# Patient Record
Sex: Female | Born: 1975 | Race: White | Hispanic: No | Marital: Married | Smoking: Never smoker
Health system: Southern US, Community
[De-identification: ages and names within clinical notes are randomized; demographics above are authoritative.]

## PROBLEM LIST (undated history)

## (undated) HISTORY — PX: CHOLECYSTECTOMY: SHX55

---

## 2008-03-17 ENCOUNTER — Inpatient Hospital Stay: Payer: Self-pay

## 2010-02-13 ENCOUNTER — Ambulatory Visit: Payer: Self-pay | Admitting: Internal Medicine

## 2010-02-20 ENCOUNTER — Ambulatory Visit: Payer: Self-pay | Admitting: Surgery

## 2010-02-23 ENCOUNTER — Ambulatory Visit: Payer: Self-pay | Admitting: Surgery

## 2012-02-02 IMAGING — US ABDOMEN ULTRASOUND
1 series · 17 of 25 positions shown · non-contrast
Comparison: none

REASON FOR EXAM: ABD PAIN EPIGASTRIC
COMMENTS:

[Series 1: abdomen ultrasound · 17 of 119 slices shown]
[im 1/119]
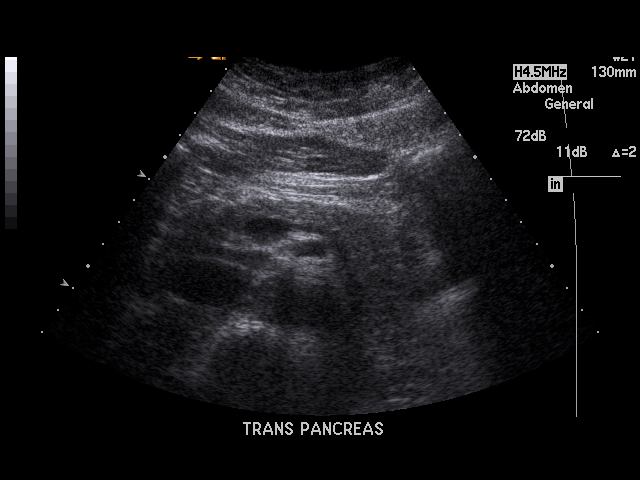
[im 10/119]
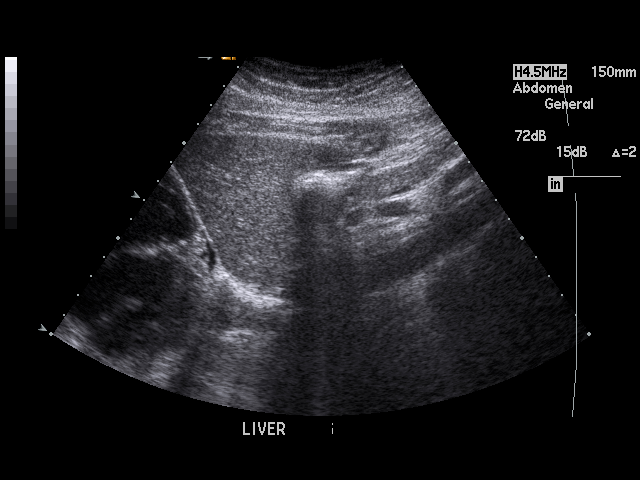
[im 15/119]
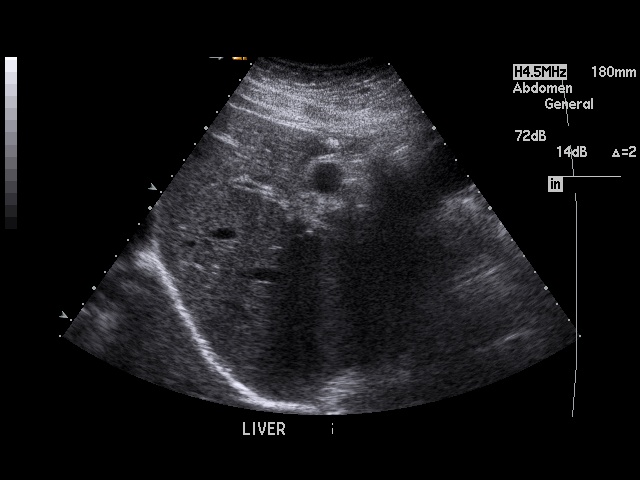
[im 25/119]
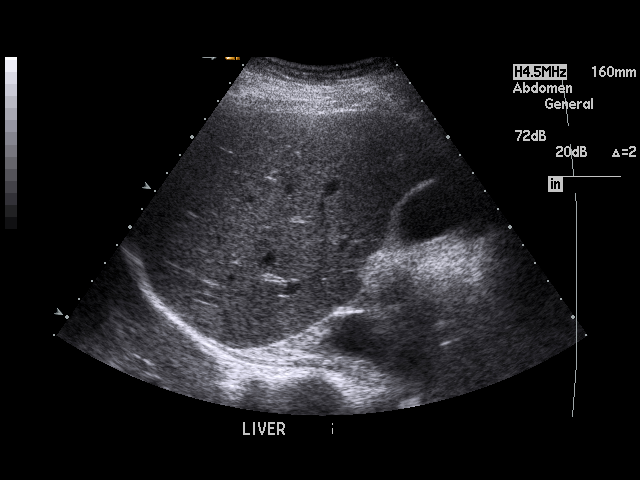
[im 30/119]
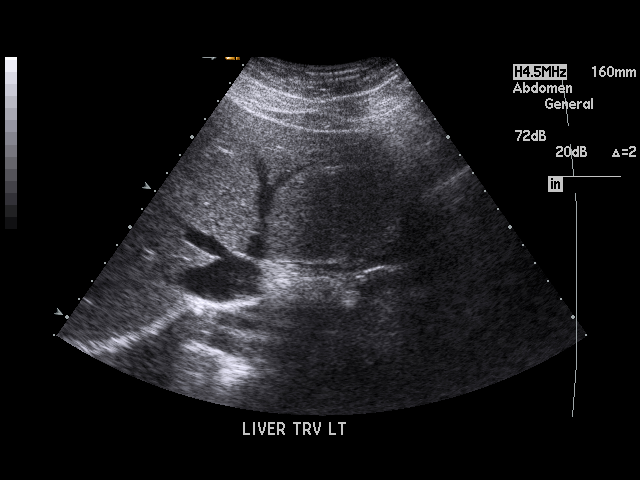
[im 40/119]
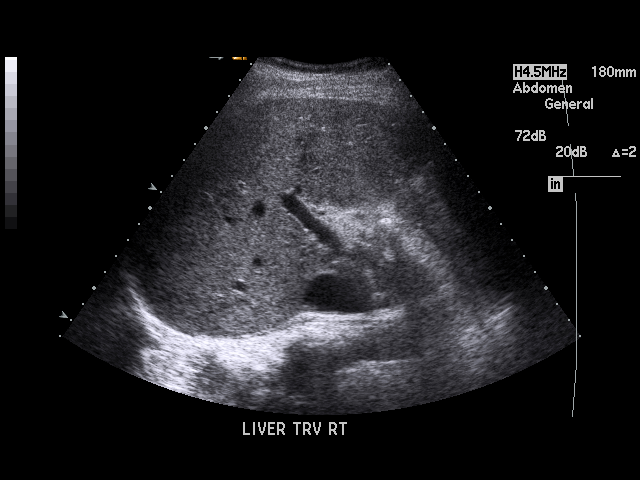
[im 45/119]
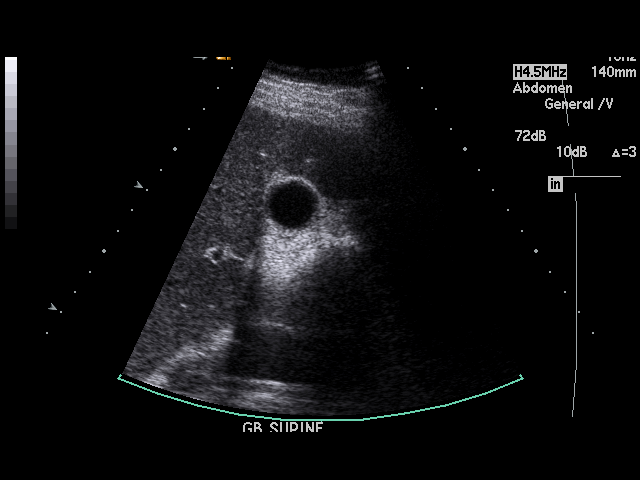
[im 55/119]
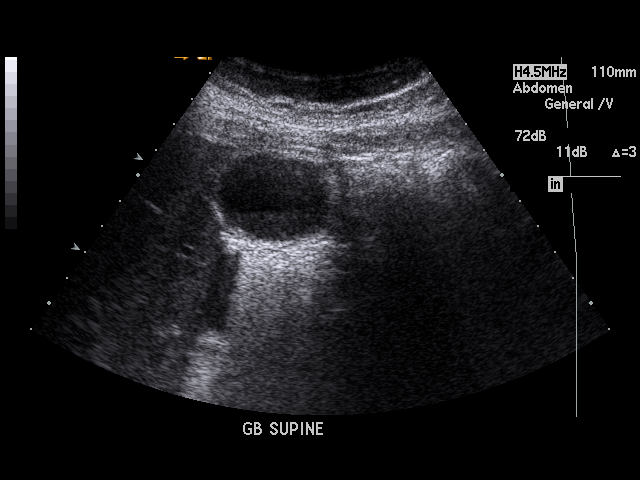
[im 60/119]
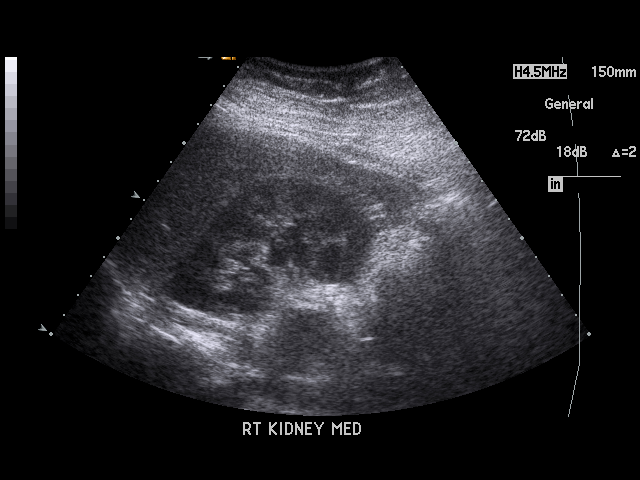
[im 64/119]
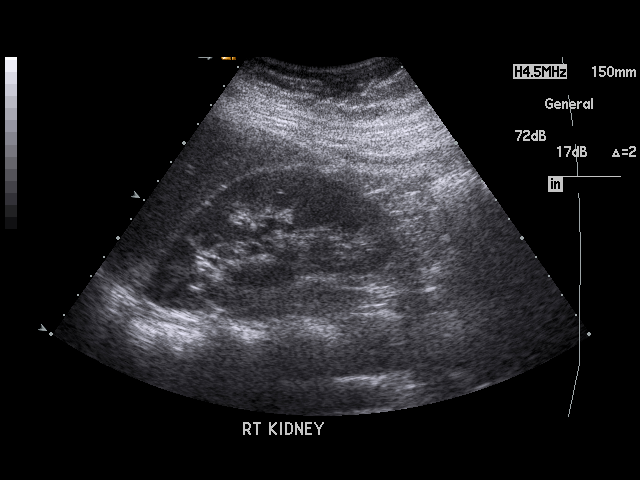
[im 74/119]
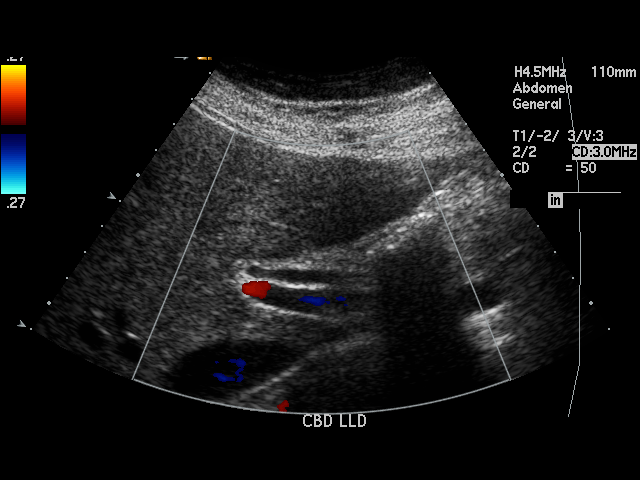
[im 79/119]
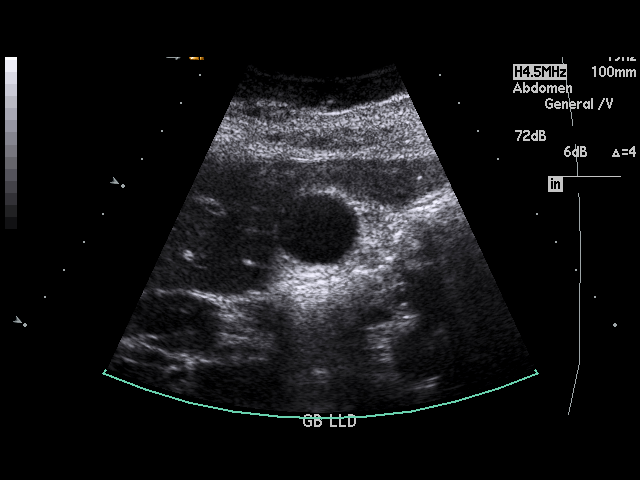
[im 89/119]
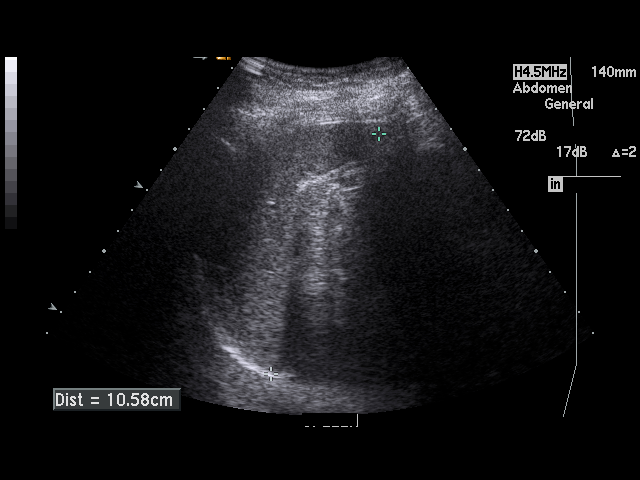
[im 94/119]
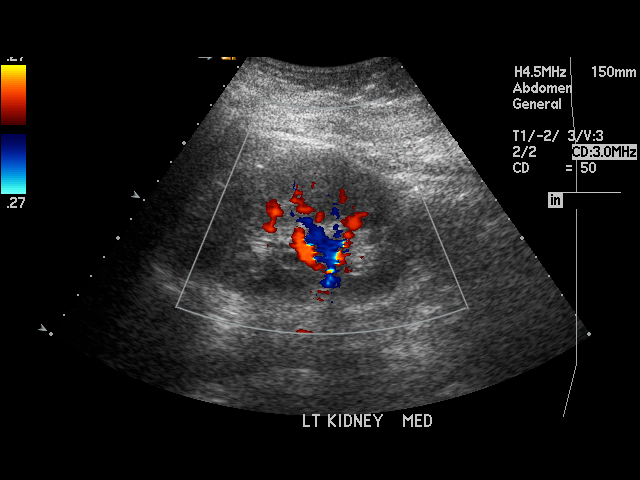
[im 104/119]
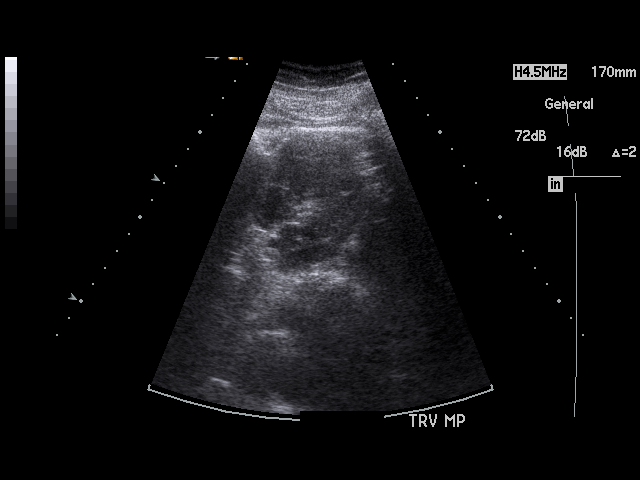
[im 109/119]
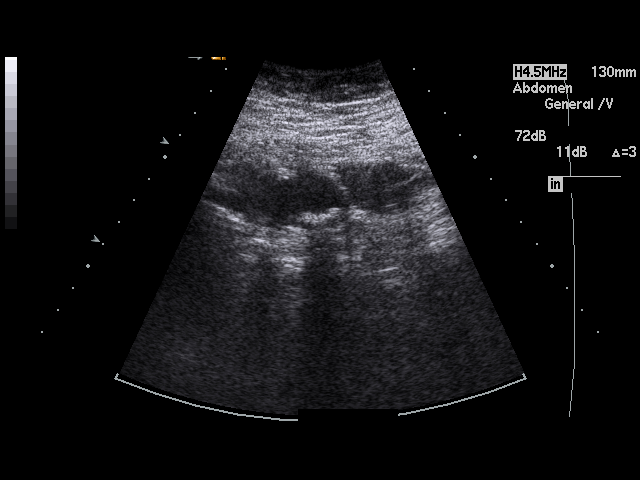
[im 119/119]
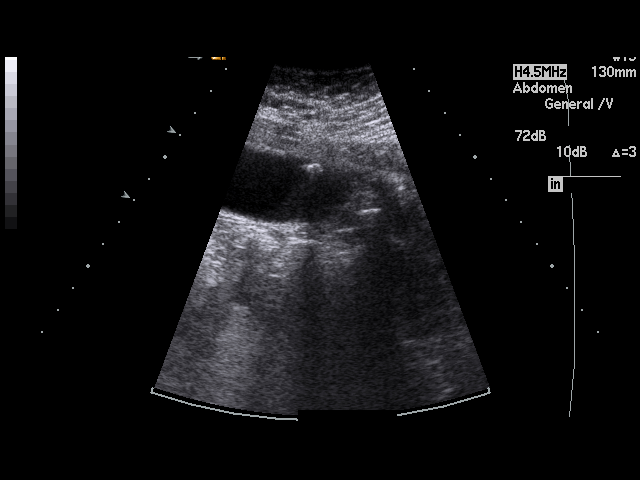

[17 of 25 positions shown; findings below may reference images not displayed]

PROCEDURE:     US  - US ABDOMEN GENERAL SURVEY  - February 13, 2010  [DATE]

RESULT:     The liver, spleen, pancreas, abdominal aorta and inferior vena
cava show no significant abnormalities. There are multiple echodensities in
the gallbladder compatible with gallstones. No thickening of the gallbladder
wall is identified. No pericholecystic fluid is seen. The common bile duct
measures 4.8 mm in diameter which is within normal limits. The kidneys show
no hydronephrosis. There is no ascites.
IMPRESSION: 1. Cholelithiasis with no definite thickening of the gallbladder wall or
pericholecystic fluid observed.
2. Sludge is present in the gallbladder.
3. The common bile duct is normal in size.
4. No ascites is seen.

## 2014-09-24 ENCOUNTER — Ambulatory Visit: Payer: Self-pay | Admitting: Physician Assistant

## 2018-01-01 ENCOUNTER — Ambulatory Visit
Admission: EM | Admit: 2018-01-01 | Discharge: 2018-01-01 | Disposition: A | Discharge status: Home / Self Care | Payer: Self-pay | Attending: Emergency Medicine | Admitting: Emergency Medicine

## 2018-01-01 ENCOUNTER — Other Ambulatory Visit: Payer: Self-pay

## 2018-01-01 DIAGNOSIS — J069 Acute upper respiratory infection, unspecified: Secondary | ICD-10-CM

## 2018-01-01 MED ORDER — BENZONATATE 200 MG PO CAPS: ORAL_CAPSULE | ORAL | 0 refills | Status: DC

## 2018-01-01 MED ORDER — HYDROCODONE-HOMATROPINE 5-1.5 MG/5ML PO SYRP: 5.0000 mL | ORAL_SOLUTION | Freq: Four times a day (QID) | ORAL | 0 refills | Status: DC | PRN

## 2018-01-01 NOTE — ED Triage Notes (Signed)
Pt with cough x past week. Denies fever. Reports phlegm is yellow/green. Chest is tight.

## 2018-01-01 NOTE — Discharge Instructions (Signed)
Use Flonase nasal spray for 3-4 weeks to promote drainage

## 2018-01-01 NOTE — ED Provider Notes (Signed)
[facility]    CSN: [id number] Arrival date & time: 01/01/18  0803     History   Chief Complaint Chief Complaint  Patient presents with  . Cough    HPI Ece Cumberland is a 42 y.o. female.   HPI  42 year old female presents with cough of yellow-green sputum that she has had for about 1 week.  Feels tightness in her chest.  Daughter is on a traveling softball team and they spend many hours in the cold wet weather she thinks may precipitated her symptoms.  She is having difficulty sleeping at night because of the consistent cough.  Tried sleeping in a recliner is unable to lie flat but no position seems to facilitate her sleep.       History reviewed. No pertinent past medical history.  There are no active problems to display for this patient.   Past Surgical History:  Procedure Laterality Date  . CHOLECYSTECTOMY      OB History   None      Home Medications    Prior to Admission medications   Medication Sig Start Date End Date Taking? Authorizing Provider  levonorgestrel (MIRENA) 20 MCG/24HR IUD 1 each by Intrauterine route once.   Yes [provider]  benzonatate (TESSALON) 200 MG capsule Take one cap TID PRN cough 01/01/18   Ovid Curd P, PA-C  HYDROcodone-homatropine [facility]) 5-1.5 MG/5ML syrup Take 5 mLs by mouth every 6 (six) hours as needed. 01/01/18   Lutricia Feil, PA-C    Family History History reviewed. No pertinent family history.  Social History Social History   Tobacco Use  . Smoking status: Never Smoker  . Smokeless tobacco: Never Used  Substance Use Topics  . Alcohol use: Yes    Comment: social  . Drug use: Never     Allergies   Patient has no known allergies.   Review of Systems Review of Systems  Constitutional: Positive for activity change. Negative for chills, fatigue and fever.  HENT: Positive for congestion.   Respiratory: Positive for cough. Negative for shortness of breath, wheezing and  stridor.   All other systems reviewed and are negative.    Physical Exam Triage Vital Signs ED Triage Vitals  Enc Vitals Group     BP 01/01/18 0815 133/84     Pulse Rate 01/01/18 0815 70     Resp 01/01/18 0815 18     Temp 01/01/18 0815 97.8 F (36.6 C)     Temp Source 01/01/18 0815 Oral     SpO2 01/01/18 0815 100 %     Weight 01/01/18 0816 206 lb (93.4 kg)     Height 01/01/18 0816 5\' 7"  (1.702 m)     Head Circumference --      Peak Flow --      Pain Score 01/01/18 0816 1     Pain Loc --      Pain Edu? --      Excl. in GC? --    No data found.  Updated Vital Signs BP 133/84 (BP Location: Right Arm)   Pulse 70   Temp 97.8 F (36.6 C) (Oral)   Resp 18   Ht 5\' 7"  (1.702 m)   Wt 206 lb (93.4 kg)   SpO2 100%   BMI 32.26 kg/m   Visual Acuity Right Eye Distance:   Left Eye Distance:   Bilateral Distance:    Right Eye Near:   Left Eye Near:    Bilateral Near:  Physical Exam  Constitutional: She is oriented to person, place, and time. She appears well-developed and well-nourished. No distress.  HENT:  Head: Normocephalic.  Right Ear: External ear normal.  Left Ear: External ear normal.  Nose: Nose normal.  Mouth/Throat: Oropharynx is clear and moist. No oropharyngeal exudate.  Eyes: Pupils are equal, round, and reactive to light. Right eye exhibits no discharge. Left eye exhibits no discharge.  Neck: Normal range of motion.  Pulmonary/Chest: Effort normal and breath sounds normal.  Musculoskeletal: Normal range of motion.  Lymphadenopathy:    She has no cervical adenopathy.  Neurological: She is alert and oriented to person, place, and time.  Skin: Skin is warm and dry. She is not diaphoretic.  Psychiatric: She has a normal mood and affect. Her behavior is normal. Judgment and thought content normal.  Nursing note and vitals reviewed.    UC Treatments / Results  Labs (all labs ordered are listed, but only abnormal results are displayed) Labs Reviewed -  No data to display  EKG None Radiology No results found.  Procedures Procedures (including critical care time)  Medications Ordered in UC Medications - No data to display   Initial Impression / Assessment and Plan / UC Course  I have reviewed the triage vital signs and the nursing notes.  Pertinent labs & imaging results that were available during my care of the patient were reviewed by me and considered in my medical decision making (see chart for details).     Plan: 1. Test/x-ray results and diagnosis reviewed with patient 2. rx as per orders; risks, benefits, potential side effects reviewed with patient 3. Recommend supportive treatment with use of Flonase nasal spray for 3-4 weeks to facilitate drainage.  Treat with cough suppressants to allow her to rest at nighttime and also to suppress the cough during the daytime.  We will increase her fluids and rest as much as feasible.  I have advised her this is a virus and does not require antibiotics at this point time.  She is not improving she should follow-up with her primary care physician or may return to our clinic 4. F/u prn if symptoms worsen or don't improve   Final Clinical Impressions(s) / UC Diagnoses   Final diagnoses:  Upper respiratory tract infection, unspecified type    ED Discharge Orders        Ordered    benzonatate (TESSALON) 200 MG capsule     01/01/18 0836    HYDROcodone-homatropine (HYCODAN) 5-1.5 MG/5ML syrup  Every 6 hours PRN     01/01/18 0836       Controlled Substance Prescriptions [place] Controlled Substance Registry consulted? Not Applicable   Lutricia FeilRoemer, Yalonda Sample P, PA-C 01/01/18 [id number]

## 2018-09-24 ENCOUNTER — Ambulatory Visit
Admission: EM | Admit: 2018-09-24 | Discharge: 2018-09-24 | Disposition: A | Discharge status: Home / Self Care | Payer: Self-pay | Attending: Family Medicine | Admitting: Family Medicine

## 2018-09-24 ENCOUNTER — Encounter: Payer: Self-pay | Admitting: Gynecology

## 2018-09-24 ENCOUNTER — Other Ambulatory Visit: Payer: Self-pay

## 2018-09-24 DIAGNOSIS — J988 Other specified respiratory disorders: Secondary | ICD-10-CM

## 2018-09-24 MED ORDER — DOXYCYCLINE HYCLATE 100 MG PO CAPS: 100.0000 mg | ORAL_CAPSULE | Freq: Two times a day (BID) | ORAL | 0 refills | Status: DC

## 2018-09-24 MED ORDER — BENZONATATE 100 MG PO CAPS: 100.0000 mg | ORAL_CAPSULE | Freq: Three times a day (TID) | ORAL | 0 refills | Status: DC | PRN

## 2018-09-24 MED ORDER — HYDROCOD POLST-CPM POLST ER 10-8 MG/5ML PO SUER: 5.0000 mL | Freq: Two times a day (BID) | ORAL | 0 refills | Status: DC | PRN

## 2018-09-24 NOTE — Discharge Instructions (Signed)
Medication as prescribed.  Take care  Dr. Sabien Umland  

## 2018-09-24 NOTE — ED Provider Notes (Signed)
[facility]    CSN: [id number] Arrival date & time: 09/24/18  1406   History   Chief Complaint Cough, congestion  HPI 42 year old female presents with cough and congestion.  Started on Tuesday.  She reports cough and chest congestion.  Cough is productive of discolored sputum.  Also reports sinus congestion.  No fever.  Seems to be worsening.  Has had no relief at home.  No known exacerbating factors.  No reports of chills.  No other associated symptoms.  No other complaints.  Social hx reviewed as below. Social History Social History   Tobacco Use  . Smoking status: Never Smoker  . Smokeless tobacco: Never Used  Substance Use Topics  . Alcohol use: Yes    Comment: social  . Drug use: Never   Allergies   Patient has no known allergies.   Review of Systems Review of Systems  Constitutional: Positive for fever.  HENT: Positive for congestion.   Respiratory: Positive for cough.    Physical Exam Triage Vital Signs ED Triage Vitals  Enc Vitals Group     BP 09/24/18 1440 129/74     Pulse Rate 09/24/18 1440 88     Resp 09/24/18 1440 18     Temp 09/24/18 1440 98.1 F (36.7 C)     Temp Source 09/24/18 1438 Oral     SpO2 09/24/18 1440 99 %     Weight 09/24/18 1439 205 lb (93 kg)     Height 09/24/18 1439 5\' 7"  (1.702 m)     Head Circumference --      Peak Flow --      Pain Score 09/24/18 1439 4     Pain Loc --      Pain Edu? --      Excl. in GC? --    Updated Vital Signs BP 129/74 (BP Location: Left Arm)   Pulse 88   Temp 98.1 F (36.7 C) (Oral)   Resp 18   Ht 5\' 7"  (1.702 m)   Wt 93 kg   SpO2 99%   BMI 32.11 kg/m   Visual Acuity Right Eye Distance:   Left Eye Distance:   Bilateral Distance:    Right Eye Near:   Left Eye Near:    Bilateral Near:     Physical Exam Vitals signs and nursing note reviewed.  Constitutional:      General: She is not in acute distress. HENT:     Head: Normocephalic and atraumatic.     Right Ear: Tympanic  membrane normal.     Left Ear: Tympanic membrane normal.     Nose: Nose normal.     Mouth/Throat:     Pharynx: Oropharynx is clear. No posterior oropharyngeal erythema.  Cardiovascular:     Rate and Rhythm: Normal rate and regular rhythm.  Pulmonary:     Effort: Pulmonary effort is normal.     Comments: Coarse breath sounds. Neurological:     Mental Status: She is alert.  Psychiatric:        Mood and Affect: Mood normal.        Behavior: Behavior normal.    UC Treatments / Results  Labs (all labs ordered are listed, but only abnormal results are displayed) Labs Reviewed - No data to display  EKG None  Radiology No results found.  Procedures Procedures (including critical care time)  Medications Ordered in UC Medications - No data to display  Initial Impression / Assessment and Plan / UC Course  I  have reviewed the triage vital signs and the nursing notes.  Pertinent labs & imaging results that were available during my care of the patient were reviewed by me and considered in my medical decision making (see chart for details).    42 year old female presents with a respiratory infection.  Treated with doxycycline given persistent symptoms and worsening.  Tessalon Perles and Tussionex for cough.  Final Clinical Impressions(s) / UC Diagnoses   Final diagnoses:  Respiratory infection     Discharge Instructions     Medication as prescribed.  Take care  Dr. Adriana Simasook    ED Prescriptions    Medication Sig Dispense Auth. Provider   benzonatate (TESSALON) 100 MG capsule Take 1 capsule (100 mg total) by mouth 3 (three) times daily as needed. 30 capsule Reeves Musick G, DO   doxycycline (VIBRAMYCIN) 100 MG capsule Take 1 capsule (100 mg total) by mouth 2 (two) times daily. 14 capsule [place], AvonJayce G, DO   chlorpheniramine-HYDROcodone (TUSSIONEX PENNKINETIC ER) 10-8 MG/5ML SUER Take 5 mLs by mouth every 12 (twelve) hours as needed. 60 mL Tommie Samsook, Tobyn Osgood G, DO     Controlled  Substance Prescriptions [place] Controlled Substance Registry consulted? Not Applicable   Tommie SamsCook, Camren Lipsett G, DO 09/24/18 1626

## 2018-09-24 NOTE — ED Triage Notes (Signed)
Patient c/o cough x 1 week.

## 2020-03-13 ENCOUNTER — Ambulatory Visit
Admission: EM | Admit: 2020-03-13 | Discharge: 2020-03-13 | Disposition: A | Discharge status: Home / Self Care | Payer: Self-pay | Attending: Family Medicine | Admitting: Family Medicine

## 2020-03-13 ENCOUNTER — Encounter: Payer: Self-pay | Admitting: Emergency Medicine

## 2020-03-13 ENCOUNTER — Other Ambulatory Visit: Payer: Self-pay

## 2020-03-13 DIAGNOSIS — K625 Hemorrhage of anus and rectum: Secondary | ICD-10-CM | POA: Insufficient documentation

## 2020-03-13 LAB — COMPREHENSIVE METABOLIC PANEL
ALT: 15 U/L (ref 0–44)
AST: 15 U/L (ref 15–41)
Albumin: 4.2 g/dL (ref 3.5–5.0)
Alkaline Phosphatase: 74 U/L (ref 38–126)
Anion gap: 9 (ref 5–15)
BUN: 10 mg/dL (ref 6–20)
CO2: 21 mmol/L — ABNORMAL LOW (ref 22–32)
Calcium: 8.9 mg/dL (ref 8.9–10.3)
Chloride: 107 mmol/L (ref 98–111)
Creatinine, Ser: 0.83 mg/dL (ref 0.44–1.00)
GFR calc Af Amer: >60 mL/min (ref >60)
GFR calc non Af Amer: >60 mL/min (ref >60)
Glucose, Bld: 111 mg/dL — ABNORMAL HIGH (ref 70–99)
Potassium: 3.9 mmol/L (ref 3.5–5.1)
Sodium: 137 mmol/L (ref 135–145)
Total Bilirubin: 0.3 mg/dL (ref 0.3–1.2)
Total Protein: 8.1 g/dL (ref 6.5–8.1)

## 2020-03-13 LAB — CBC WITH DIFFERENTIAL/PLATELET
Abs Immature Granulocytes: 0.04 10*3/uL (ref 0.00–0.07)
Basophils Absolute: 0 10*3/uL (ref 0.0–0.1)
Basophils Relative: 1 %
Eosinophils Absolute: 0.1 10*3/uL (ref 0.0–0.5)
Eosinophils Relative: 1 %
HCT: 41.3 % (ref 36.0–46.0)
Hemoglobin: 13.7 g/dL (ref 12.0–15.0)
Immature Granulocytes: 1 %
Lymphocytes Relative: 25 %
Lymphs Abs: 2 10*3/uL (ref 0.7–4.0)
MCH: 28.1 pg (ref 26.0–34.0)
MCHC: 33.2 g/dL (ref 30.0–36.0)
MCV: 84.6 fL (ref 80.0–100.0)
Monocytes Absolute: 0.4 10*3/uL (ref 0.1–1.0)
Monocytes Relative: 5 %
Neutro Abs: 5.6 10*3/uL (ref 1.7–7.7)
Neutrophils Relative %: 67 %
Platelets: 264 10*3/uL (ref 150–400)
RBC: 4.88 MIL/uL (ref 3.87–5.11)
RDW: 12.6 % (ref 11.5–15.5)
WBC: 8.2 10*3/uL (ref 4.0–10.5)
nRBC: 0 % (ref 0.0–0.2)

## 2020-03-13 NOTE — Discharge Instructions (Addendum)
CBC normal.  I am placing referral to GI.  You should receive a call soon.   Take care  Dr. Adriana Simas

## 2020-03-13 NOTE — ED Provider Notes (Signed)
[facility]    CSN: [id number] Arrival date & time: 03/13/20  0815  History   Chief Complaint Chief Complaint  Patient presents with  . Blood In Stools   HPI  44 year old female presents with the above complaint.  Patient reports that she had problems yesterday and noted bright red blood in the toilet.  Occurred again today.  Patient reports that she has a family history of colon cancer.  She states that her mother had colon cancer in her 69s.  She was told that she needs screening earlier than the age of 64.  Denies rectal pain.  No nausea, vomiting, diarrhea.  No fever.  No night sweats.  No weight loss.  Patient denies abdominal pain.  She has no other associated symptoms.  No other complaints.  Past Surgical History:  Procedure Laterality Date  . CHOLECYSTECTOMY     Home Medications    Prior to Admission medications   Medication Sig Start Date End Date Taking? Authorizing Provider  levonorgestrel (MIRENA) 20 MCG/24HR IUD 1 each by Intrauterine route once.   Yes [provider]    Family History Family History  Problem Relation Age of Onset  . Cancer Mother     Social History Social History   Tobacco Use  . Smoking status: Never Smoker  . Smokeless tobacco: Never Used  Vaping Use  . Vaping Use: Never used  Substance Use Topics  . Alcohol use: Yes    Comment: social  . Drug use: Never     Allergies   Patient has no known allergies.   Review of Systems Review of Systems  Constitutional: Negative for appetite change, fever and unexpected weight change.  Gastrointestinal: Negative for abdominal pain.       Rectal bleeding.   Physical Exam Triage Vital Signs ED Triage Vitals  Enc Vitals Group     BP 03/13/20 0838 124/77     Pulse Rate 03/13/20 0838 (!) 103     Resp 03/13/20 0838 16     Temp 03/13/20 0838 98.4 F (36.9 C)     Temp Source 03/13/20 0838 Oral     SpO2 03/13/20 0838 98 %     Weight 03/13/20 0836 212 lb (96.2 kg)      Height 03/13/20 0836 5\' 7"  (1.702 m)     Head Circumference --      Peak Flow --      Pain Score 03/13/20 0836 0     Pain Loc --      Pain Edu? --      Excl. in [place]? --    Updated Vital Signs BP 124/77   Pulse (!) 103   Temp 98.4 F (36.9 C) (Oral)   Resp 16   Ht 5\' 7"  (1.702 m)   Wt 96.2 kg   SpO2 98%   BMI 33.20 kg/m   Visual Acuity Right Eye Distance:   Left Eye Distance:   Bilateral Distance:    Right Eye Near:   Left Eye Near:    Bilateral Near:     Physical Exam Vitals and nursing note reviewed.  Constitutional:      General: She is not in acute distress.    Appearance: Normal appearance. She is not ill-appearing.  HENT:     Head: Normocephalic and atraumatic.  Eyes:     General:        Right eye: No discharge.        Left eye: No discharge.  Conjunctiva/sclera: Conjunctivae normal.  Cardiovascular:     Rate and Rhythm: Normal rate and regular rhythm.     Heart sounds: No murmur heard.   Pulmonary:     Effort: Pulmonary effort is normal.     Breath sounds: Normal breath sounds. No wheezing, rhonchi or rales.  Abdominal:     General: There is no distension.     Palpations: Abdomen is soft.     Tenderness: There is no abdominal tenderness.  Neurological:     Mental Status: She is alert.  Psychiatric:        Mood and Affect: Mood normal.        Behavior: Behavior normal.    UC Treatments / Results  Labs (all labs ordered are listed, but only abnormal results are displayed) Labs Reviewed  COMPREHENSIVE METABOLIC PANEL - Abnormal; Notable for the following components:      Result Value   CO2 21 (*)    Glucose, Bld 111 (*)    All other components within normal limits  CBC WITH DIFFERENTIAL/PLATELET    EKG   Radiology No results found.  Procedures Procedures (including critical care time)  Medications Ordered in UC Medications - No data to display  Initial Impression / Assessment and Plan / UC Course  I have reviewed the triage  vital signs and the nursing notes.  Pertinent labs & imaging results that were available during my care of the patient were reviewed by me and considered in my medical decision making (see chart for details).    44 year old female presents with painless rectal bleeding.  CBC normal.  Metabolic panel unremarkable.  Placing referral to GI.  Needs colonoscopy.  Supportive care.  Final Clinical Impressions(s) / UC Diagnoses   Final diagnoses:  Rectal bleeding     Discharge Instructions     CBC normal.  I am placing referral to GI.  You should receive a call soon.   Take care  Dr. Adriana Simas    ED Prescriptions    None     PDMP not reviewed this encounter.   Tommie Sams, [place] 03/13/20 1028

## 2020-03-13 NOTE — ED Triage Notes (Signed)
Patient states she had a BM yesterday with bright red blood in her stool. She states this morning she had another episode.

## 2020-03-17 ENCOUNTER — Encounter: Payer: Self-pay | Admitting: Gastroenterology

## 2020-03-17 ENCOUNTER — Ambulatory Visit (INDEPENDENT_AMBULATORY_CARE_PROVIDER_SITE_OTHER): Payer: Self-pay | Admitting: Gastroenterology

## 2020-03-17 ENCOUNTER — Other Ambulatory Visit: Payer: Self-pay

## 2020-03-17 VITALS — BP 142/84 | HR 111 | Temp 98.3°F | Ht 67.0 in | Wt 236.4 lb

## 2020-03-17 DIAGNOSIS — K625 Hemorrhage of anus and rectum: Secondary | ICD-10-CM

## 2020-03-17 DIAGNOSIS — Z800 Family history of malignant neoplasm of digestive organs: Secondary | ICD-10-CM

## 2020-03-17 MED ORDER — PEG 3350-KCL-NA BICARB-NACL 420 G PO SOLR: ORAL | 0 refills | Status: AC

## 2020-03-17 NOTE — Progress Notes (Signed)
Jasmine Liu [address]  [place]  [place], [place] [address]  Main: [phone]  Fax: [phone]   Gastroenterology Consultation  Referring Provider:     Coral Spikes, DO Primary Care Physician:  Patient, No Pcp Per Reason for Consultation:     Bright red blood per rectum        HPI:    Chief Complaint  Patient presents with  . New Patient (Initial Visit)  . Rectal Bleeding    Patient stated that Jasmine Liu has had rectal bleeding since Thursday.    Jasmine Liu is a 44 y.o. y/o female referred for consultation & management  by Dr. Patient, No Pcp Per.  Patient went to the urgent care recently due to 1 to 2 days of bleeding of blood streaks in stool.  This has since resolved.  No prior history of similar symptoms.  No melena.  No abdominal pain.  No nausea or vomiting.  Reports having bowel movement alternating between 1-2 formed bowel movements daily, and then some days with no bowel movement, and this has been her baseline.  Reports family history of colon cancer in her mother.  No prior colonoscopy.  No dysphagia.  No past medical history on file.  Past Surgical History:  Procedure Laterality Date  . CHOLECYSTECTOMY      Prior to Admission medications   Medication Sig Start Date End Date Taking? Authorizing Provider  levonorgestrel (MIRENA) 20 MCG/24HR IUD 1 each by Intrauterine route once.    [provider]  polyethylene glycol-electrolytes (NULYTELY) 420 g solution Prepare according to package instructions. Starting at 5:00 PM: Drink one 8 oz glass of mixture every 15 minutes until you finish half of the jug. Five hours prior to procedure, drink 8 oz glass of mixture every 15 minutes until it is all gone. Make sure you do not drink anything 4 hours prior to your procedure. 03/17/20   Virgel Manifold, MD    Family History  Problem Relation Age of Onset  . Cancer Mother      Social History   Tobacco Use  . Smoking status: Never Smoker  .  Smokeless tobacco: Never Used  Vaping Use  . Vaping Use: Never used  Substance Use Topics  . Alcohol use: Yes    Comment: social  . Drug use: Never    Allergies as of 03/17/2020  . (No Known Allergies)    Review of Systems:    All systems reviewed and negative except where noted in HPI.   Physical Exam:  BP (!) 142/84   Pulse (!) 111   Temp 98.3 F (36.8 C) (Oral)   Ht 5\' 7"  (1.702 m)   Wt 236 lb 6.4 oz (107.2 kg)   BMI 37.03 kg/m  No LMP recorded. (Menstrual status: IUD). Psych:  Alert and cooperative. Normal mood and affect. General:   Alert,  Well-developed, well-nourished, pleasant and cooperative in NAD Head:  Normocephalic and atraumatic. Eyes:  Sclera clear, no icterus.   Conjunctiva pink. Ears:  Normal auditory acuity. Nose:  No deformity, discharge, or lesions. Mouth:  No deformity or lesions,oropharynx pink & moist. Neck:  Supple; no masses or thyromegaly. Abdomen:  Normal bowel sounds.  No bruits.  Soft, non-tender and non-distended without masses, hepatosplenomegaly or hernias noted.  No guarding or rebound tenderness.    Msk:  Symmetrical without gross deformities. Good, equal movement & strength bilaterally. Pulses:  Normal pulses noted. Extremities:  No clubbing or edema.  No cyanosis.  Neurologic:  Alert and oriented x3;  grossly normal neurologically. Skin:  Intact without significant lesions or rashes. No jaundice. Lymph Nodes:  No significant cervical adenopathy. Psych:  Alert and cooperative. Normal mood and affect.   Labs: CBC    Component Value Date/Time   WBC 8.2 03/13/2020 0910   RBC 4.88 03/13/2020 0910   HGB 13.7 03/13/2020 0910   HCT 41.3 03/13/2020 0910   PLT 264 03/13/2020 0910   MCV 84.6 03/13/2020 0910   MCH 28.1 03/13/2020 0910   MCHC 33.2 03/13/2020 0910   RDW 12.6 03/13/2020 0910   LYMPHSABS 2.0 03/13/2020 0910   MONOABS 0.4 03/13/2020 0910   EOSABS 0.1 03/13/2020 0910   BASOSABS 0.0 03/13/2020 0910   CMP     Component  Value Date/Time   NA 137 03/13/2020 0910   K 3.9 03/13/2020 0910   CL 107 03/13/2020 0910   CO2 21 (L) 03/13/2020 0910   GLUCOSE 111 (H) 03/13/2020 0910   BUN 10 03/13/2020 0910   CREATININE 0.83 03/13/2020 0910   CALCIUM 8.9 03/13/2020 0910   PROT 8.1 03/13/2020 0910   ALBUMIN 4.2 03/13/2020 0910   AST 15 03/13/2020 0910   ALT 15 03/13/2020 0910   ALKPHOS 74 03/13/2020 0910   BILITOT 0.3 03/13/2020 0910   GFRNONAA >60 03/13/2020 0910   GFRAA >60 03/13/2020 0910    Imaging Studies: No results found.  Assessment and Plan:   Jasmine Liu is a 44 y.o. y/o female has been referred for bright red blood per rectum which has since resolved  Symptoms could be due to underlying hemorrhoids Patient has family history of colon cancer qualifies for high risk screening  Risks and benefits of colonoscopy discussed in detail with the patient and to proceed with scheduling the procedure.  Alternative of conservative management discussed in detail as well and pt prefers to proceed with colonoscopy and rule out any large polyps or cancer given history of colon cancer in her mother  I have discussed alternative options, risks & benefits,  which include, but are not limited to, bleeding, infection, perforation,respiratory complication & drug reaction.  The patient agrees with this plan & written consent will be obtained.        Dr Melodie Bouillon  Speech recognition software was used to dictate the above note.

## 2020-03-19 ENCOUNTER — Other Ambulatory Visit
Admission: RE | Admit: 2020-03-19 | Discharge: 2020-03-19 | Disposition: A | Discharge status: Home / Self Care | Payer: HRSA Program | Source: Ambulatory Visit | Attending: Gastroenterology | Admitting: Gastroenterology

## 2020-03-19 ENCOUNTER — Other Ambulatory Visit: Payer: Self-pay

## 2020-03-19 DIAGNOSIS — Z01812 Encounter for preprocedural laboratory examination: Secondary | ICD-10-CM | POA: Diagnosis present

## 2020-03-19 DIAGNOSIS — Z20822 Contact with and (suspected) exposure to covid-19: Secondary | ICD-10-CM | POA: Diagnosis not present

## 2020-03-20 ENCOUNTER — Encounter: Payer: Self-pay | Admitting: Gastroenterology

## 2020-03-20 LAB — SARS CORONAVIRUS 2 (TAT 6-24 HRS): SARS Coronavirus 2: NEGATIVE

## 2020-03-21 ENCOUNTER — Ambulatory Visit
Admission: RE | Admit: 2020-03-21 | Discharge: 2020-03-21 | Disposition: A | Discharge status: Home / Self Care | Payer: Self-pay | Attending: Gastroenterology | Admitting: Gastroenterology

## 2020-03-21 ENCOUNTER — Ambulatory Visit: Payer: Self-pay | Admitting: Certified Registered Nurse Anesthetist

## 2020-03-21 ENCOUNTER — Encounter
Admission: RE | Disposition: A | Discharge status: Home / Self Care | Payer: Self-pay | Source: Home / Self Care | Attending: Gastroenterology

## 2020-03-21 ENCOUNTER — Other Ambulatory Visit: Payer: Self-pay

## 2020-03-21 ENCOUNTER — Encounter: Payer: Self-pay | Admitting: Gastroenterology

## 2020-03-21 DIAGNOSIS — Z6836 Body mass index (BMI) 36.0-36.9, adult: Secondary | ICD-10-CM | POA: Insufficient documentation

## 2020-03-21 DIAGNOSIS — Z975 Presence of (intrauterine) contraceptive device: Secondary | ICD-10-CM | POA: Insufficient documentation

## 2020-03-21 DIAGNOSIS — E669 Obesity, unspecified: Secondary | ICD-10-CM | POA: Insufficient documentation

## 2020-03-21 DIAGNOSIS — Z1211 Encounter for screening for malignant neoplasm of colon: Secondary | ICD-10-CM | POA: Insufficient documentation

## 2020-03-21 DIAGNOSIS — Z800 Family history of malignant neoplasm of digestive organs: Secondary | ICD-10-CM

## 2020-03-21 DIAGNOSIS — K648 Other hemorrhoids: Secondary | ICD-10-CM | POA: Insufficient documentation

## 2020-03-21 HISTORY — PX: COLONOSCOPY WITH PROPOFOL: SHX5780

## 2020-03-21 LAB — POCT PREGNANCY, URINE: Preg Test, Ur: NEGATIVE

## 2020-03-21 SURGERY — COLONOSCOPY WITH PROPOFOL
Anesthesia: General

## 2020-03-21 MED ORDER — SODIUM CHLORIDE 0.9 % IV SOLN: INTRAVENOUS | Status: DC

## 2020-03-21 MED ORDER — PHENYLEPHRINE HCL (PRESSORS) 10 MG/ML IV SOLN
INTRAVENOUS | Status: DC | PRN
  Administered 2020-03-21 (×2): 100 ug via INTRAVENOUS

## 2020-03-21 MED ORDER — LIDOCAINE HCL (PF) 2 % IJ SOLN
INTRAMUSCULAR | Status: AC
  Filled 2020-03-21: qty 5

## 2020-03-21 MED ORDER — PROPOFOL 10 MG/ML IV BOLUS
INTRAVENOUS | Status: DC | PRN
  Administered 2020-03-21: 80 mg via INTRAVENOUS

## 2020-03-21 MED ORDER — GLYCOPYRROLATE 0.2 MG/ML IJ SOLN
INTRAMUSCULAR | Status: DC | PRN
  Administered 2020-03-21: .1 mg via INTRAVENOUS

## 2020-03-21 MED ORDER — ONDANSETRON HCL 4 MG/2ML IJ SOLN
INTRAMUSCULAR | Status: DC | PRN
Start: 2020-03-21 — End: 2020-03-21
  Administered 2020-03-21: 4 mg via INTRAVENOUS

## 2020-03-21 MED ORDER — PROPOFOL 500 MG/50ML IV EMUL
INTRAVENOUS | Status: DC | PRN
  Administered 2020-03-21: 150 ug/kg/min via INTRAVENOUS

## 2020-03-21 MED ORDER — ONDANSETRON HCL 4 MG/2ML IJ SOLN
INTRAMUSCULAR | Status: AC
  Filled 2020-03-21: qty 2

## 2020-03-21 MED ORDER — LIDOCAINE HCL (CARDIAC) PF 100 MG/5ML IV SOSY
PREFILLED_SYRINGE | INTRAVENOUS | Status: DC | PRN
  Administered 2020-03-21: 50 mg via INTRAVENOUS

## 2020-03-21 MED ORDER — PROPOFOL 500 MG/50ML IV EMUL
INTRAVENOUS | Status: AC
  Filled 2020-03-21: qty 50

## 2020-03-21 NOTE — Op Note (Signed)
[facility] Gastroenterology Patient Name: Jasmine Liu Procedure Date: 03/21/2020 8:58 AM MRN: [id number] Account #: [id number] Date of Birth: 31-May-1976 Admit Type: Outpatient Age: 44 Room: [facility] ENDO ROOM 4 Gender: Female Note Status: Finalized Procedure:             Colonoscopy Indications:           Screening in patient at increased risk: Family history                         of 1st-degree relative with colorectal cancer Providers:             Ozzie Knobel B. Maximino Greenland MD, MD Referring MD:          Sallye Lat Md, MD (Referring MD) Medicines:             Monitored Anesthesia Care Complications:         No immediate complications. Procedure:             Pre-Anesthesia Assessment:                        - Prior to the procedure, a History and Physical was                         performed, and patient medications, allergies and                         sensitivities were reviewed. The patient's tolerance                         of previous anesthesia was reviewed.                        - The risks and benefits of the procedure and the                         sedation options and risks were discussed with the                         patient. All questions were answered and informed                         consent was obtained.                        - Patient identification and proposed procedure were                         verified prior to the procedure by the physician, the                         nurse, the anesthetist and the technician. The                         procedure was verified in the pre-procedure area in                         the procedure room in the endoscopy suite.                        -  ASA Grade Assessment: II - A patient with mild                         systemic disease.                        - After reviewing the risks and benefits, the patient                         was deemed in satisfactory condition to undergo the                          procedure.                        After obtaining informed consent, the colonoscope was                         passed under direct vision. Throughout the procedure,                         the patient's blood pressure, pulse, and oxygen                         saturations were monitored continuously. The                         Colonoscope was introduced through the anus and                         advanced to the the cecum, identified by appendiceal                         orifice and ileocecal valve. The colonoscopy was                         performed with ease. The patient tolerated the                         procedure well. The quality of the bowel preparation                         was good. Findings:      The perianal and digital rectal examinations were normal.      The rectum, sigmoid colon, descending colon, transverse colon, ascending       colon and cecum appeared normal.      Non-bleeding internal hemorrhoids were found during retroflexion. Impression:            - The rectum, sigmoid colon, descending colon,                         transverse colon, ascending colon and cecum are normal.                        - Non-bleeding internal hemorrhoids.                        - No specimens collected. Recommendation:        - High fiber diet.                        -  Discharge patient to home.                        - Resume previous diet.                        - Continue present medications.                        - Repeat colonoscopy in 5 years for screening purposes.                        - Return to primary care physician as previously                         scheduled.                        - The findings and recommendations were discussed with                         the patient.                        - The findings and recommendations were discussed with                         the patient's family. Procedure Code(s):     --- Professional ---                         [phone], Colonoscopy, flexible; diagnostic, including                         collection of specimen(s) by brushing or washing, when                         performed (separate procedure) Diagnosis Code(s):     --- Professional ---                        Z80.0, Family history of malignant neoplasm of                         digestive organs CPT copyright 2019 American Medical Association. All rights reserved. The codes documented in this report are preliminary and upon coder review may  be revised to meet current compliance requirements.  Vonda Antigua, MD Margretta Sidle B. Bonna Gains MD, MD 03/21/2020 9:38:37 AM This report has been signed electronically. Number of Addenda: 0 Note Initiated On: 03/21/2020 8:58 AM Scope Withdrawal Time: 0 hours 25 minutes 36 seconds  Total Procedure Duration: 0 hours 30 minutes 55 seconds       [facility]

## 2020-03-21 NOTE — Transfer of Care (Signed)
Immediate Anesthesia Transfer of Care Note  Patient: Jasmine Liu  Procedure(s) Performed: COLONOSCOPY WITH PROPOFOL (N/A )  Patient Location: PACU  Anesthesia Type:General  Level of Consciousness: awake, alert  and oriented  Airway & Oxygen Therapy: Patient Spontanous Breathing and Patient connected to nasal cannula oxygen  Post-op Assessment: Report given to RN and Post -op Vital signs reviewed and stable  Post vital signs: Reviewed and stable  Last Vitals:  Vitals Value Taken Time  BP 103/50 03/21/20 0939  Temp    Pulse 101 03/21/20 0940  Resp 19 03/21/20 0940  SpO2 98 % 03/21/20 0940  Vitals shown include unvalidated device data.  Last Pain:  Vitals:   03/21/20 0821  TempSrc: Temporal         Complications: No complications documented.

## 2020-03-21 NOTE — Anesthesia Postprocedure Evaluation (Signed)
Anesthesia Post Note  Patient: Jasmine Liu  Procedure(s) Performed: COLONOSCOPY WITH PROPOFOL (N/A )  Patient location during evaluation: Endoscopy Anesthesia Type: General Level of consciousness: awake and alert and oriented Pain management: pain level controlled Vital Signs Assessment: post-procedure vital signs reviewed and stable Respiratory status: spontaneous breathing, nonlabored ventilation and respiratory function stable Cardiovascular status: blood pressure returned to baseline and stable Postop Assessment: no signs of nausea or vomiting Anesthetic complications: no   No complications documented.   Last Vitals:  Vitals:   03/21/20 0948 03/21/20 0958  BP: (!) 106/45 (!) 98/54  Pulse: 83 77  Resp: 20 16  Temp:    SpO2: 99% 100%    Last Pain:  Vitals:   03/21/20 0958  TempSrc:   PainSc: 0-No pain                 Davette Nugent

## 2020-03-21 NOTE — Anesthesia Preprocedure Evaluation (Signed)
Anesthesia Evaluation  Patient identified by MRN, date of birth, ID band Patient awake    Reviewed: Allergy & Precautions, NPO status , Patient's Chart, lab work & pertinent test results  History of Anesthesia Complications Negative for: history of anesthetic complications  Airway Mallampati: II  TM Distance: >3 FB Neck ROM: Full    Dental no notable dental hx.    Pulmonary neg pulmonary ROS, neg sleep apnea, neg COPD,    breath sounds clear to auscultation- rhonchi (-) wheezing      Cardiovascular Exercise Tolerance: Good (-) hypertension(-) CAD and (-) Past MI  Rhythm:Regular Rate:Normal - Systolic murmurs and - Diastolic murmurs    Neuro/Psych negative neurological ROS  negative psych ROS   GI/Hepatic negative GI ROS, Neg liver ROS,   Endo/Other  negative endocrine ROSneg diabetes  Renal/GU negative Renal ROS     Musculoskeletal negative musculoskeletal ROS (+)   Abdominal (+) + obese,   Peds  Hematology negative hematology ROS (+)   Anesthesia Other Findings   Reproductive/Obstetrics                             Anesthesia Physical Anesthesia Plan  ASA: II  Anesthesia Plan: General   Post-op Pain Management:    Induction: Intravenous  PONV Risk Score and Plan: 2 and Propofol infusion  Airway Management Planned: Natural Airway  Additional Equipment:   Intra-op Plan:   Post-operative Plan:   Informed Consent: I have reviewed the patients History and Physical, chart, labs and discussed the procedure including the risks, benefits and alternatives for the proposed anesthesia with the patient or authorized representative who has indicated his/her understanding and acceptance.     Dental advisory given  Plan Discussed with: CRNA and Anesthesiologist  Anesthesia Plan Comments:         Anesthesia Quick Evaluation  

## 2020-03-21 NOTE — H&P (Signed)
Melodie Bouillon, MD [address], [place], [place], [address] [address], [place], [place], [address] Phone: [phone]  Fax: [phone]  Primary Care Physician:  Patient, No Pcp Per   Pre-Procedure History & Physical: HPI:  Jasmine Liu is a 44 y.o. female is here for a colonoscopy.   History reviewed. No pertinent past medical history.  Past Surgical History:  Procedure Laterality Date  . CHOLECYSTECTOMY      Prior to Admission medications   Medication Sig Start Date End Date Taking? Authorizing Provider  polyethylene glycol-electrolytes (NULYTELY) 420 g solution Prepare according to package instructions. Starting at 5:00 PM: Drink one 8 oz glass of mixture every 15 minutes until you finish half of the jug. Five hours prior to procedure, drink 8 oz glass of mixture every 15 minutes until it is all gone. Make sure you do not drink anything 4 hours prior to your procedure. 03/17/20  Yes Pasty Spillers, MD  levonorgestrel (MIRENA) 20 MCG/24HR IUD 1 each by Intrauterine route once.    [provider]    Allergies as of 03/17/2020  . (No Known Allergies)    Family History  Problem Relation Age of Onset  . Cancer Mother     Social History   Socioeconomic History  . Marital status: Married    Spouse name: Not on file  . Number of children: Not on file  . Years of education: Not on file  . Highest education level: Not on file  Occupational History  . Not on file  Tobacco Use  . Smoking status: Never Smoker  . Smokeless tobacco: Never Used  Vaping Use  . Vaping Use: Never used  Substance and Sexual Activity  . Alcohol use: Yes    Comment: social none last 24hrs  . Drug use: Never  . Sexual activity: Not on file  Other Topics Concern  . Not on file  Social History Narrative  . Not on file   Social Determinants of Health   Financial Resource Strain:   . Difficulty of Paying Living Expenses:   Food Insecurity:   .  Worried About Programme researcher, broadcasting/film/video in the Last Year:   . Barista in the Last Year:   Transportation Needs:   . Freight forwarder (Medical):   Marland Kitchen Lack of Transportation (Non-Medical):   Physical Activity:   . Days of Exercise per Week:   . Minutes of Exercise per Session:   Stress:   . Feeling of Stress :   Social Connections:   . Frequency of Communication with Friends and Family:   . Frequency of Social Gatherings with Friends and Family:   . Attends Religious Services:   . Active Member of Clubs or Organizations:   . Attends Banker Meetings:   Marland Kitchen Marital Status:   Intimate Partner Violence:   . Fear of Current or Ex-Partner:   . Emotionally Abused:   Marland Kitchen Physically Abused:   . Sexually Abused:     Review of Systems: See HPI, otherwise negative ROS  Physical Exam: BP 130/70   Pulse 86   Temp 97.8 F (36.6 C) (Temporal)   Resp 20   Ht 5\' 7"  (1.702 m)   Wt 106.6 kg   SpO2 97%   BMI 36.81 kg/m  General:   Alert,  pleasant and cooperative in NAD Head:  Normocephalic and atraumatic. Neck:  Supple; no masses or thyromegaly. Lungs:  Clear throughout to auscultation, normal  respiratory effort.    Heart:  +S1, +S2, Regular rate and rhythm, No edema. Abdomen:  Soft, nontender and nondistended. Normal bowel sounds, without guarding, and without rebound.   Neurologic:  Alert and  oriented x4;  grossly normal neurologically.  Impression/Plan: Jasmine Liu is here for a colonoscopy to be performed for high risk screening.  Risks, benefits, limitations, and alternatives regarding  colonoscopy have been reviewed with the patient.  Questions have been answered.  All parties agreeable.   Virgel Manifold, MD  03/21/2020, 8:57 AM

## 2020-03-24 ENCOUNTER — Encounter: Payer: Self-pay | Admitting: Gastroenterology

## 2023-12-04 ENCOUNTER — Encounter: Payer: Self-pay | Admitting: Emergency Medicine

## 2023-12-04 ENCOUNTER — Ambulatory Visit
Admission: EM | Admit: 2023-12-04 | Discharge: 2023-12-04 | Disposition: A | Discharge status: Home / Self Care | Attending: Emergency Medicine | Admitting: Emergency Medicine

## 2023-12-04 DIAGNOSIS — R051 Acute cough: Secondary | ICD-10-CM | POA: Diagnosis not present

## 2023-12-04 DIAGNOSIS — J069 Acute upper respiratory infection, unspecified: Secondary | ICD-10-CM | POA: Diagnosis present

## 2023-12-04 DIAGNOSIS — R0981 Nasal congestion: Secondary | ICD-10-CM

## 2023-12-04 LAB — RESP PANEL BY RT-PCR (FLU A&B, COVID) ARPGX2
Influenza A by PCR: NEGATIVE
Influenza B by PCR: NEGATIVE
SARS Coronavirus 2 by RT PCR: NEGATIVE

## 2023-12-04 MED ORDER — PROMETHAZINE-DM 6.25-15 MG/5ML PO SYRP: 5.0000 mL | ORAL_SOLUTION | Freq: Four times a day (QID) | ORAL | 0 refills | Status: AC | PRN

## 2023-12-04 NOTE — ED Provider Notes (Signed)
 [facility]    CSN: [id number] Arrival date & time: 12/04/23  0934      History   Chief Complaint Chief Complaint  Patient presents with   Cough    HPI Jasmine Liu is a 48 y.o. female presenting for approximately 2-day history of cough, congestion, runny nose, postnasal drainage and scratchy throat.  Denies fever, fatigue, body aches, headaches, chest pain, wheezing or shortness of breath, vomiting or diarrhea.  Her husband has been sick for about a week and a half with similar symptoms.  He was seen here a couple days ago and given an antibiotic, inhaler, cough medicine and nasal spray and she was not sure if she needed the same treatment.  She has been taking over-the-counter medication.  HPI  History reviewed. No pertinent past medical history.  Patient Active Problem List   Diagnosis Date Noted   FH: colon cancer     Past Surgical History:  Procedure Laterality Date   CHOLECYSTECTOMY     COLONOSCOPY WITH PROPOFOL N/A 03/21/2020   Procedure: COLONOSCOPY WITH PROPOFOL;  Surgeon: Pasty Spillers, MD;  Location: [facility] ENDOSCOPY;  Service: Endoscopy;  Laterality: N/A;    OB History   No obstetric history on file.      Home Medications    Prior to Admission medications   Medication Sig Start Date End Date Taking? Authorizing Provider  levonorgestrel (MIRENA) 20 MCG/24HR IUD 1 each by Intrauterine route once.   Yes [provider]  promethazine-dextromethorphan (PROMETHAZINE-DM) 6.25-15 MG/5ML syrup Take 5 mLs by mouth 4 (four) times daily as needed. 12/04/23  Yes Eusebio Friendly B, PA-C  polyethylene glycol-electrolytes (NULYTELY) 420 g solution Prepare according to package instructions. Starting at 5:00 PM: Drink one 8 oz glass of mixture every 15 minutes until you finish half of the jug. Five hours prior to procedure, drink 8 oz glass of mixture every 15 minutes until it is all gone. Make sure you do not drink anything 4 hours prior to your  procedure. 03/17/20   Pasty Spillers, MD    Family History Family History  Problem Relation Age of Onset   Cancer Mother     Social History Social History   Tobacco Use   Smoking status: Never   Smokeless tobacco: Never  Vaping Use   Vaping status: Never Used  Substance Use Topics   Alcohol use: Yes    Comment: social none last 24hrs   Drug use: Never     Allergies   Patient has no known allergies.   Review of Systems Review of Systems  Constitutional:  Negative for chills, diaphoresis, fatigue and fever.  HENT:  Positive for congestion, postnasal drip and rhinorrhea. Negative for ear pain, sinus pressure, sinus pain and sore throat.   Respiratory:  Positive for cough. Negative for shortness of breath and wheezing.   Cardiovascular:  Negative for chest pain.  Gastrointestinal:  Negative for abdominal pain, nausea and vomiting.  Musculoskeletal:  Negative for arthralgias and myalgias.  Skin:  Negative for rash.  Neurological:  Negative for weakness and headaches.  Hematological:  Negative for adenopathy.     Physical Exam Triage Vital Signs ED Triage Vitals [12/04/23 1057]  Encounter Vitals Group     BP      Systolic BP Percentile      Diastolic BP Percentile      Pulse      Resp      Temp      Temp src  SpO2      Weight 250 lb (113.4 kg)     Height 5\' 7"  (1.702 m)     Head Circumference      Peak Flow      Pain Score 0     Pain Loc      Pain Education      Exclude from Growth Chart    No data found.  Updated Vital Signs BP 125/81 (BP Location: Right Arm)   Pulse 90   Temp 98.1 F (36.7 C) (Oral)   Resp 15   Ht 5\' 7"  (1.702 m)   Wt 250 lb (113.4 kg)   SpO2 95%   BMI 39.16 kg/m       Physical Exam Vitals and nursing note reviewed.  Constitutional:      General: She is not in acute distress.    Appearance: Normal appearance. She is not ill-appearing or toxic-appearing.  HENT:     Head: Normocephalic and atraumatic.     Right  Ear: Tympanic membrane, ear canal and external ear normal.     Left Ear: Tympanic membrane, ear canal and external ear normal.     Nose: Congestion present.     Mouth/Throat:     Mouth: Mucous membranes are moist.     Pharynx: Oropharynx is clear.  Eyes:     General: No scleral icterus.       Right eye: No discharge.        Left eye: No discharge.     Conjunctiva/sclera: Conjunctivae normal.  Cardiovascular:     Rate and Rhythm: Normal rate and regular rhythm.     Heart sounds: Normal heart sounds.  Pulmonary:     Effort: Pulmonary effort is normal. No respiratory distress.     Breath sounds: Normal breath sounds.  Musculoskeletal:     Cervical back: Neck supple.  Skin:    General: Skin is dry.  Neurological:     General: No focal deficit present.     Mental Status: She is alert. Mental status is at baseline.     Motor: No weakness.     Gait: Gait normal.  Psychiatric:        Mood and Affect: Mood normal.        Behavior: Behavior normal.      UC Treatments / Results  Labs (all labs ordered are listed, but only abnormal results are displayed) Labs Reviewed  RESP PANEL BY RT-PCR (FLU A&B, COVID) [id number]    EKG   Radiology No results found.  Procedures Procedures (including critical care time)  Medications Ordered in UC Medications - No data to display  Initial Impression / Assessment and Plan / UC Course  I have reviewed the triage vital signs and the nursing notes.  Pertinent labs & imaging results that were available during my care of the patient were reviewed by me and considered in my medical decision making (see chart for details).   48 year old female presents for 2-day history of cough, congestion and runny nose.  Has been around her husband who has had similar symptoms.  He is currently being treated with antibiotics as he has been sick for over a week and a half.  Vitals are stable and normal.  Patient overall well-appearing.  No acute distress.  On  exam has mild nasal congestion.  Throat is clear.  Chest clear to auscultation.  Respiratory panel all negative.  Explained to patient she has a viral URI.  Supportive care encouraged with increasing  rest and fluids.  Sent Promethazine DM to pharmacy.  She has nasal spray at home that she will use.  Reviewed typical course of most viral illnesses.  Reviewed return and ER precautions.  Work note given.   Final Clinical Impressions(s) / UC Diagnoses   Final diagnoses:  Viral upper respiratory tract infection  Acute cough  Nasal congestion     Discharge Instructions      -Negative COVID, flu testing. You have another virus.  URI/COLD SYMPTOMS: Your exam today is consistent with a viral illness. Antibiotics are not indicated at this time. Use medications as directed, including cough syrup, nasal saline, and decongestants. Your symptoms should improve over the next few days and resolve within a couple of weeks. Increase rest and fluids. F/u if symptoms worsen or predominate such as sore throat, ear pain, productive cough, shortness of breath, or if you develop high fevers or worsening fatigue over the next several days.       ED Prescriptions     Medication Sig Dispense Auth. Provider   promethazine-dextromethorphan (PROMETHAZINE-DM) 6.25-15 MG/5ML syrup Take 5 mLs by mouth 4 (four) times daily as needed. 118 mL Shirlee Latch, PA-C      PDMP not reviewed this encounter.   Shirlee Latch, PA-C 12/04/23 1144

## 2023-12-04 NOTE — ED Triage Notes (Signed)
 Patient c/o cough, chest congestion and sinus congestion that started on Friday.

## 2023-12-04 NOTE — Discharge Instructions (Addendum)
-  Negative COVID, flu testing. You have another virus.  URI/COLD SYMPTOMS: Your exam today is consistent with a viral illness. Antibiotics are not indicated at this time. Use medications as directed, including cough syrup, nasal saline, and decongestants. Your symptoms should improve over the next few days and resolve within a couple of weeks. Increase rest and fluids. F/u if symptoms worsen or predominate such as sore throat, ear pain, productive cough, shortness of breath, or if you develop high fevers or worsening fatigue over the next several days.
